# Patient Record
Sex: Male | Born: 2000 | Race: Black or African American | Hispanic: No | Marital: Single | Smoking: Never smoker
Health system: Southern US, Community
[De-identification: ages and names within clinical notes are randomized; demographics above are authoritative.]

---
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
## 2020-12-15 ENCOUNTER — Ambulatory Visit (INDEPENDENT_AMBULATORY_CARE_PROVIDER_SITE_OTHER): Payer: Medicaid Other

## 2020-12-15 ENCOUNTER — Ambulatory Visit (HOSPITAL_COMMUNITY)
Admission: EM | Admit: 2020-12-15 | Discharge: 2020-12-15 | Disposition: A | Discharge status: Home / Self Care | Payer: Medicaid Other | Attending: Emergency Medicine | Admitting: Emergency Medicine

## 2020-12-15 ENCOUNTER — Emergency Department (HOSPITAL_COMMUNITY): Payer: Medicaid Other

## 2020-12-15 ENCOUNTER — Encounter (HOSPITAL_COMMUNITY): Payer: Self-pay | Admitting: Emergency Medicine

## 2020-12-15 ENCOUNTER — Emergency Department (HOSPITAL_COMMUNITY)
Admission: EM | Admit: 2020-12-15 | Discharge: 2020-12-15 | Disposition: A | Discharge status: Home / Self Care | Payer: Medicaid Other | Attending: Emergency Medicine | Admitting: Emergency Medicine

## 2020-12-15 ENCOUNTER — Encounter (HOSPITAL_COMMUNITY): Payer: Self-pay

## 2020-12-15 ENCOUNTER — Other Ambulatory Visit: Payer: Self-pay

## 2020-12-15 DIAGNOSIS — Y9367 Activity, basketball: Secondary | ICD-10-CM | POA: Insufficient documentation

## 2020-12-15 DIAGNOSIS — S43005A Unspecified dislocation of left shoulder joint, initial encounter: Secondary | ICD-10-CM

## 2020-12-15 DIAGNOSIS — W19XXXA Unspecified fall, initial encounter: Secondary | ICD-10-CM | POA: Insufficient documentation

## 2020-12-15 DIAGNOSIS — S4992XA Unspecified injury of left shoulder and upper arm, initial encounter: Secondary | ICD-10-CM | POA: Diagnosis present

## 2020-12-15 MED ORDER — OXYCODONE-ACETAMINOPHEN 5-325 MG PO TABS
1.0000 | ORAL_TABLET | Freq: Once | ORAL | Status: AC
  Administered 2020-12-15: 1 via ORAL
  Filled 2020-12-15: qty 1

## 2020-12-15 MED ORDER — LIDOCAINE HCL (PF) 1 % IJ SOLN
30.0000 mL | Freq: Once | INTRAMUSCULAR | Status: DC
  Filled 2020-12-15: qty 30

## 2020-12-15 NOTE — ED Triage Notes (Signed)
Pt from UC with left shoulder deformity after playing basketball. Currently immobilized in a sling with ice.

## 2020-12-15 NOTE — Discharge Instructions (Addendum)
Go to the Emergency Room for shoulder reduction and further evaluation.

## 2020-12-15 NOTE — ED Provider Notes (Signed)
[facility]    CSN: [id number] Arrival date & time: 12/15/20  1253      History   Chief Complaint Chief Complaint  Patient presents with  . Shoulder Injury    HPI Dakota Baldwin is a 20 y.o. male.   Patient here for evaluation of left shoulder pain after falling while playing basketball approximately 1 hour ago.  Reports concerned that he dislocated it.  Reports dislocating shoulder in the past.  Reports decreased range of motion and sharp pain.  Denies any fevers, chest pain, shortness of breath, N/V/D, numbness, tingling, weakness, abdominal pain, or headaches.       History reviewed. No pertinent past medical history.  There are no problems to display for this patient.   History reviewed. No pertinent surgical history.     Home Medications    Prior to Admission medications   Medication Sig Start Date End Date Taking? Authorizing Provider  HYDROcodone-acetaminophen (NORCO/VICODIN) 5-325 MG per tablet Take 1 tablet by mouth every 4 (four) hours as needed. Patient not taking: No sig reported 03/14/14   Mirian Mo, MD  Pseudoephedrine-APAP-DM (DAYQUIL PO) Take by mouth.    [provider]    Family History Family History  Problem Relation Age of Onset  . Healthy Mother   . Healthy Father     Social History Social History   Tobacco Use  . Smoking status: Never Smoker  . Smokeless tobacco: Never Used  Substance Use Topics  . Alcohol use: No  . Drug use: No     Allergies   Patient has no known allergies.   Review of Systems Review of Systems  Musculoskeletal: Positive for arthralgias and joint swelling.  All other systems reviewed and are negative.    Physical Exam Triage Vital Signs ED Triage Vitals  Enc Vitals Group     BP 12/15/20 1311 125/72     Pulse Rate 12/15/20 1311 (!) 53     Resp 12/15/20 1311 15     Temp 12/15/20 1311 97.7 F (36.5 C)     Temp Source 12/15/20 1311 Oral     SpO2 12/15/20 1311 100 %      Weight --      Height --      Head Circumference --      Peak Flow --      Pain Score 12/15/20 1310 6     Pain Loc --      Pain Edu? --      Excl. in GC? --    No data found.  Updated Vital Signs BP 125/72 (BP Location: Right Arm)   Pulse (!) 53   Temp 97.7 F (36.5 C) (Oral)   Resp 15   SpO2 100%   Visual Acuity Right Eye Distance:   Left Eye Distance:   Bilateral Distance:    Right Eye Near:   Left Eye Near:    Bilateral Near:     Physical Exam Vitals and nursing note reviewed.  Constitutional:      General: He is not in acute distress.    Appearance: Normal appearance. He is not ill-appearing, toxic-appearing or diaphoretic.  HENT:     Head: Normocephalic and atraumatic.  Eyes:     Conjunctiva/sclera: Conjunctivae normal.  Cardiovascular:     Rate and Rhythm: Normal rate.     Pulses: Normal pulses.  Pulmonary:     Effort: Pulmonary effort is normal.  Abdominal:     General: Abdomen is flat.  Musculoskeletal:     Left shoulder: Deformity and bony tenderness present. No swelling. Decreased range of motion. Decreased strength. Normal pulse.     Cervical back: Normal range of motion.  Skin:    General: Skin is warm and dry.  Neurological:     General: No focal deficit present.     Mental Status: He is alert and oriented to person, place, and time.  Psychiatric:        Mood and Affect: Mood normal.      UC Treatments / Results  Labs (all labs ordered are listed, but only abnormal results are displayed) Labs Reviewed - No data to display  EKG   Radiology DG Shoulder Left  Result Date: 12/15/2020 CLINICAL DATA:  Fall, left shoulder pain EXAM: LEFT SHOULDER - 2+ VIEW COMPARISON:  None. FINDINGS: Anteroinferior dislocation of the left glenohumeral joint. Suspected Hill-Sachs impaction deformity of the humeral head. No definite glenoid fracture. AC joint is intact and normal in appearance. Remaining visualized osseous structures are intact. Soft tissues  within normal limits. IMPRESSION: Anteroinferior dislocation of the left glenohumeral joint with suspected Hill-Sachs impaction deformity of the humeral head. Attention on postreduction films. Electronically Signed   By: Duanne Guess D.O.   On: 12/15/2020 13:40    Procedures Procedures (including critical care time)  Medications Ordered in UC Medications - No data to display  Initial Impression / Assessment and Plan / UC Course  I have reviewed the triage vital signs and the nursing notes.  Pertinent labs & imaging results that were available during my care of the patient were reviewed by me and considered in my medical decision making (see chart for details).    X-ray shows anterior inferior dislocation of the left glenohumeral joint with suspected Hill-Sachs impaction deformity of the humeral head. Sling applied and patient sent to ED for further evaluation and reduction of his shoulder.  Patient verbalized understanding and will transport himself to the ED via private vehicle.   Final Clinical Impressions(s) / UC Diagnoses   Final diagnoses:  Dislocation of left shoulder joint, initial encounter     Discharge Instructions     Go to the Emergency Room for shoulder reduction and further evaluation.     ED Prescriptions    None     PDMP not reviewed this encounter.   Ivette Loyal, NP 12/15/20 1355

## 2020-12-15 NOTE — ED Notes (Signed)
Patient is being discharged from the Urgent Care and sent to the Emergency Department via pov.  Per Chales Salmon, NP, patient is in need of higher level of care due to shoulder dislocation. Patient is aware and verbalizes understanding of plan of care.  Vitals:   12/15/20 1311  BP: 125/72  Pulse: (!) 53  Resp: 15  Temp: 97.7 F (36.5 C)  SpO2: 100%

## 2020-12-15 NOTE — Discharge Instructions (Addendum)
Please call the orthopaedics group listed above and schedule an appointment to be seen in 1 week.

## 2020-12-15 NOTE — ED Provider Notes (Signed)
[facility] EMERGENCY DEPARTMENT Provider Note   CSN: [id number] Arrival date & time: 12/15/20  1357     History Chief Complaint  Patient presents with  . Shoulder Injury  . Dislocation    Left shoulder     Dakota Baldwin is a 20 y.o. male.  The history is provided by the patient and medical records.  Shoulder Injury This is a new problem. The current episode started 3 to 5 hours ago. The problem occurs constantly. The problem has not changed since onset.Pertinent negatives include no chest pain, no abdominal pain, no headaches and no shortness of breath. The symptoms are aggravated by twisting, standing and exertion. Nothing relieves the symptoms. He has tried a cold compress for the symptoms. The treatment provided no relief.   Patient reports no medical problems. He was playing basketball and fell backwards on outstretched arm and felt immediate pain in his left shoulder. He has had shoulder dislocations on the left shoulder before and usually they get back into place on their own. He did not hit his head.  He reports no other injuries.     History reviewed. No pertinent past medical history.  There are no problems to display for this patient.   History reviewed. No pertinent surgical history.     Family History  Problem Relation Age of Onset  . Healthy Mother   . Healthy Father     Social History   Tobacco Use  . Smoking status: Never Smoker  . Smokeless tobacco: Never Used  Substance Use Topics  . Alcohol use: No  . Drug use: No    Home Medications Prior to Admission medications   Medication Sig Start Date End Date Taking? Authorizing Provider  HYDROcodone-acetaminophen (NORCO/VICODIN) 5-325 MG per tablet Take 1 tablet by mouth every 4 (four) hours as needed. Patient not taking: No sig reported 03/14/14   Mirian Mo, MD  Pseudoephedrine-APAP-DM (DAYQUIL PO) Take by mouth.    [provider]    Allergies    Patient has  no known allergies.  Review of Systems   Review of Systems  Constitutional: Negative for chills and fever.  HENT: Negative for ear pain and sore throat.   Eyes: Negative for pain and visual disturbance.  Respiratory: Negative for cough and shortness of breath.   Cardiovascular: Negative for chest pain and palpitations.  Gastrointestinal: Negative for abdominal pain and vomiting.  Genitourinary: Negative for dysuria and hematuria.  Musculoskeletal: Positive for arthralgias (L shoulder). Negative for back pain.  Skin: Negative for color change and rash.  Neurological: Negative for seizures, syncope and headaches.  All other systems reviewed and are negative.   Physical Exam Updated Vital Signs BP 138/90   Pulse 70   Temp 97.9 F (36.6 C) (Oral)   Resp 16   SpO2 100%   Physical Exam Vitals and nursing note reviewed.  Constitutional:      Appearance: He is well-developed.  HENT:     Head: Normocephalic and atraumatic.  Eyes:     Conjunctiva/sclera: Conjunctivae normal.  Cardiovascular:     Rate and Rhythm: Normal rate and regular rhythm.     Heart sounds: No murmur heard.   Pulmonary:     Effort: Pulmonary effort is normal. No respiratory distress.     Breath sounds: Normal breath sounds.  Abdominal:     Palpations: Abdomen is soft.     Tenderness: There is no abdominal tenderness.  Musculoskeletal:     Left shoulder: Deformity,  tenderness and bony tenderness present. Decreased range of motion. Normal pulse.     Cervical back: Normal and neck supple.     Thoracic back: Normal.     Lumbar back: Normal.     Comments:  LUE: Strong radial pulse Sensation intact for all nerve distributions. No open wounds. No other TTP about the elbow or wrist.  Skin:    General: Skin is warm and dry.  Neurological:     General: No focal deficit present.     Mental Status: He is alert.     Cranial Nerves: Cranial nerves are intact.     Sensory: Sensation is intact.     Motor:  Motor function is intact.     ED Results / Procedures / Treatments   Labs (all labs ordered are listed, but only abnormal results are displayed) Labs Reviewed - No data to display  EKG None  Radiology DG Shoulder Left  Result Date: 12/15/2020 CLINICAL DATA:  Post reduction. EXAM: LEFT SHOULDER - 2+ VIEW COMPARISON:  Earlier same day FINDINGS: Two views study shows interval reduction of the left shoulder dislocation. Acromioclavicular and coracoclavicular distances are preserved. No evidence for fracture. IMPRESSION: Interval reduction of the left shoulder dislocation. Electronically Signed   By: Kennith Center M.D.   On: 12/15/2020 16:20   DG Shoulder Left  Result Date: 12/15/2020 CLINICAL DATA:  Shoulder dislocation. EXAM: LEFT SHOULDER - 2+ VIEW COMPARISON:  12/15/2020 FINDINGS: Persistent anterior and inferior dislocation of the shoulder with Hill-Sachs deformity. AC joint intact. IMPRESSION: Persistent shoulder dislocation with Hill-Sachs deformity. Electronically Signed   By: Marlan Palau M.D.   On: 12/15/2020 14:46   DG Shoulder Left  Result Date: 12/15/2020 CLINICAL DATA:  Fall, left shoulder pain EXAM: LEFT SHOULDER - 2+ VIEW COMPARISON:  None. FINDINGS: Anteroinferior dislocation of the left glenohumeral joint. Suspected Hill-Sachs impaction deformity of the humeral head. No definite glenoid fracture. AC joint is intact and normal in appearance. Remaining visualized osseous structures are intact. Soft tissues within normal limits. IMPRESSION: Anteroinferior dislocation of the left glenohumeral joint with suspected Hill-Sachs impaction deformity of the humeral head. Attention on postreduction films. Electronically Signed   By: Duanne Guess D.O.   On: 12/15/2020 13:40    Procedures Reduction of dislocation  Date/Time: 12/15/2020 3:44 PM Performed by: Ardeen Fillers, DO Authorized by: Benjiman Core, MD  Consent: Verbal consent obtained. Risks and benefits: risks,  benefits and alternatives were discussed Consent given by: patient Required items: required blood products, implants, devices, and special equipment available Patient identity confirmed: verbally with patient Time out: Immediately prior to procedure a "time out" was called to verify the correct patient, procedure, equipment, support staff and site/side marked as required. Preparation: Patient was prepped and draped in the usual sterile fashion. Local anesthesia used: no  Anesthesia: Local anesthesia used: no  Sedation: Patient sedated: no  Patient tolerance: patient tolerated the procedure well with no immediate complications Comments: Left shoulder dislocation via Kocher method. Pulse, motor and sensory function intact post reduction.      Medications Ordered in ED Medications  oxyCODONE-acetaminophen (PERCOCET/ROXICET) 5-325 MG per tablet 1 tablet (1 tablet Oral Given 12/15/20 1423)    ED Course  I have reviewed the triage vital signs and the nursing notes.  Pertinent labs & imaging results that were available during my care of the patient were reviewed by me and considered in my medical decision making (see chart for details).    MDM Rules/Calculators/A&P  This is a 20 year old male with no prior past medical history who presented to the emergency department with a anteriorly dislocated left shoulder. Shoulder was reduced per procedure note above. Post reduction x-ray confirmed reduction. Placed in a sling. Follow-up instructions given to [facility] in 1 week. NSAIDS, Tylenol prn for pain.  D.c in good condition   Final Clinical Impression(s) / ED Diagnoses Final diagnoses:  Dislocation of left shoulder joint, initial encounter    Rx / DC Orders ED Discharge Orders    None       Ardeen Fillers, DO 12/15/20 1631    Benjiman Core, MD 12/16/20 [phone]

## 2020-12-15 NOTE — ED Triage Notes (Signed)
Pt reports he fell and dislocated the left shoulder playing Basketball 1 hr ago. Pt reports pain.

## 2020-12-15 NOTE — ED Provider Notes (Signed)
Emergency Medicine Provider Triage Evaluation Note  Dakota Baldwin , a 20 y.o. male  was evaluated in triage.  Pt complains of possible left shoulder dislocation.  States that he was playing vascular when this happened.  This is happened to him before.  Denies any numbness or other injuries.  Review of Systems  Positive: Left shoulder deformity and pain Negative: Numbness  Physical Exam  BP 123/81   Pulse (!) 54   Temp 97.9 F (36.6 C) (Oral)   Resp 14   SpO2 100%  Gen:   Awake, no distress Resp:  Normal effort MSK:   Moves extremities without difficulty Other:  Deformity of the left shoulder.  2+ radial pulse palpated.  Normal sensation to light touch  Medical Decision Making  Medically screening exam initiated at 2:14 PM.  Appropriate orders placed.  Dakota Baldwin was informed that the remainder of the evaluation will be completed by another provider, this initial triage assessment does not replace that evaluation, and the importance of remaining in the ED until their evaluation is complete.  Will require x-ray for this likely dislocation   Dietrich Pates, PA-C 12/15/20 1415    Margarita Grizzle, MD 12/19/20 1340

## 2021-04-24 ENCOUNTER — Other Ambulatory Visit: Payer: Self-pay

## 2021-04-24 ENCOUNTER — Emergency Department (HOSPITAL_COMMUNITY): Payer: Medicaid Other

## 2021-04-24 ENCOUNTER — Emergency Department (HOSPITAL_COMMUNITY)
Admission: EM | Admit: 2021-04-24 | Discharge: 2021-04-25 | Disposition: A | Discharge status: Home / Self Care | Payer: Medicaid Other | Attending: Emergency Medicine | Admitting: Emergency Medicine

## 2021-04-24 ENCOUNTER — Encounter (HOSPITAL_COMMUNITY): Payer: Self-pay | Admitting: Emergency Medicine

## 2021-04-24 DIAGNOSIS — M24411 Recurrent dislocation, right shoulder: Secondary | ICD-10-CM | POA: Insufficient documentation

## 2021-04-24 MED ORDER — METHOCARBAMOL 500 MG PO TABS
750.0000 mg | ORAL_TABLET | Freq: Once | ORAL | Status: AC
  Administered 2021-04-24: 750 mg via ORAL
  Filled 2021-04-24: qty 2

## 2021-04-24 NOTE — ED Provider Notes (Signed)
MSE was initiated and I personally evaluated the patient and placed orders (if any) at  11:38 PM on April 24, 2021.  Right shoulder dislocation. H/O multiple dislocations in the past. Occurred while playing basketball prior to arrival.   Today's Vitals   04/24/21 2331 04/24/21 2338  BP: 124/81   Pulse: (!) 57   Resp: 18   SpO2: 100%   Weight:  72 kg  Height:  5\' 11"  (1.803 m)  PainSc:  10-Worst pain ever   Body mass index is 22.14 kg/m.  Anterior dislocation right shoulder Neurovascularly intact  Attempt at reduction unsuccessful in triage.  The patient appears stable so that the remainder of the MSE may be completed by another provider.   , PA-C 04/24/21 2339    2340, MD 04/25/21 [phone]

## 2021-04-24 NOTE — ED Triage Notes (Signed)
Patient lost his balance and fell while playing basketball this evening sustained right shoulder dislocation and bilateral knees skin abrasions .

## 2021-04-25 ENCOUNTER — Emergency Department (HOSPITAL_COMMUNITY): Payer: Medicaid Other

## 2021-04-25 MED ORDER — FENTANYL CITRATE PF 50 MCG/ML IJ SOSY
100.0000 ug | PREFILLED_SYRINGE | Freq: Once | INTRAMUSCULAR | Status: AC
  Administered 2021-04-25: 100 ug via INTRAVENOUS
  Filled 2021-04-25: qty 2

## 2021-04-25 MED ORDER — DIAZEPAM 2 MG PO TABS
2.0000 mg | ORAL_TABLET | Freq: Once | ORAL | Status: AC
  Administered 2021-04-25: 2 mg via ORAL
  Filled 2021-04-25: qty 1

## 2021-04-25 NOTE — ED Provider Notes (Signed)
[facility] EMERGENCY DEPARTMENT Provider Note   CSN: [id number] Arrival date & time: 04/24/21  2029     History Chief Complaint  Patient presents with   R-Shoulder Dislocation     Dakota Baldwin is a 20 y.o. male.  20 yo M here with recurrent shoulder dislocation. Had an episode in June where he dislocated his right shoulder and had it reduced in the emergency room.  He was doing fine and had a follow-up with orthopedics without intervention.  Denies play basketball and fell in his side and and suffered another right shoulder pain and abnormality concerning for dislocation.  Came in for further evaluation.  No injuries elsewhere.  He does have some abrasions but not significantly painful.       History reviewed. No pertinent past medical history.  There are no problems to display for this patient.   History reviewed. No pertinent surgical history.     Family History  Problem Relation Age of Onset   Healthy Mother    Healthy Father     Social History   Tobacco Use   Smoking status: Never   Smokeless tobacco: Never  Substance Use Topics   Alcohol use: No   Drug use: No    Home Medications Prior to Admission medications   Medication Sig Start Date End Date Taking? Authorizing Provider  HYDROcodone-acetaminophen (NORCO/VICODIN) 5-325 MG per tablet Take 1 tablet by mouth every 4 (four) hours as needed. Patient not taking: No sig reported 03/14/14   Mirian Mo, MD  Pseudoephedrine-APAP-DM (DAYQUIL PO) Take by mouth.    [provider]    Allergies    Patient has no known allergies.  Review of Systems   Review of Systems  All other systems reviewed and are negative.  Physical Exam Updated Vital Signs BP 102/83   Pulse 60   Resp 14   Ht 5\' 11"  (1.803 m)   Wt 72 kg   SpO2 100%   BMI 22.14 kg/m   Physical Exam Vitals and nursing note reviewed.  Constitutional:      Appearance: He is well-developed.  HENT:     Head:  Normocephalic and atraumatic.  Eyes:     Pupils: Pupils are equal, round, and reactive to light.  Cardiovascular:     Rate and Rhythm: Normal rate.  Pulmonary:     Effort: Pulmonary effort is normal. No respiratory distress.  Abdominal:     General: Abdomen is flat. There is no distension.  Musculoskeletal:        General: Tenderness and deformity (right shoulder) present. Normal range of motion.     Cervical back: Normal range of motion.  Skin:    General: Skin is warm and dry.  Neurological:     General: No focal deficit present.     Mental Status: He is alert.    ED Results / Procedures / Treatments   Labs (all labs ordered are listed, but only abnormal results are displayed) Labs Reviewed - No data to display  EKG None  Radiology DG Shoulder Right  Result Date: 04/25/2021 CLINICAL DATA:  Glenohumeral reduction EXAM: RIGHT SHOULDER - 2+ VIEW COMPARISON:  None. FINDINGS: Anatomic alignment at the right glenohumeral joint following reduction. No visible fracture. IMPRESSION: Anatomic alignment of the right glenohumeral joint following reduction. Electronically Signed   By: 06/25/2021 M.D.   On: 04/25/2021 02:54   DG Shoulder Right  Result Date: 04/25/2021 CLINICAL DATA:  Shoulder dislocation EXAM: RIGHT SHOULDER -  2+ VIEW COMPARISON:  None. FINDINGS: Anterior dislocation at the right glenohumeral joint. No fracture visible IMPRESSION: Anterior right glenohumeral joint dislocation. Electronically Signed   By: Deatra Robinson M.D.   On: 04/25/2021 00:18    Procedures .Ortho Injury Treatment  Date/Time: 04/25/2021 2:56 AM Performed by: Marily Memos, MD Authorized by: Marily Memos, MD   Consent:    Consent obtained:  Verbal   Consent given by:  Patient   Risks discussed:  Fracture, irreducible dislocation, recurrent dislocation, nerve damage, restricted joint movement, stiffness and vascular damage   Alternatives discussed:  No treatment, alternative treatment,  immobilization, referral and delayed treatmentInjury location: shoulder Location details: right shoulder Injury type: dislocation Dislocation type: anterior Hill-Sachs deformity: no Chronicity: recurrent Pre-procedure neurovascular assessment: neurovascularly intact Pre-procedure distal perfusion: normal Pre-procedure neurological function: normal Pre-procedure range of motion: reduced  Anesthesia: Local anesthesia used: no  Patient sedated: NoManipulation performed: yes Reduction method: Kocher technique. Reduction successful: yes X-ray confirmed reduction: yes Immobilization: sling Splint Applied by: ED Provider Post-procedure neurovascular assessment: post-procedure neurovascularly intact Post-procedure distal perfusion: normal Post-procedure neurological function: normal Post-procedure range of motion: improved     Medications Ordered in ED Medications  methocarbamol (ROBAXIN) tablet 750 mg (750 mg Oral Given 04/24/21 2343)  diazepam (VALIUM) tablet 2 mg (2 mg Oral Given 04/25/21 0215)  fentaNYL (SUBLIMAZE) injection 100 mcg (100 mcg Intravenous Given 04/25/21 0215)    ED Course  I have reviewed the triage vital signs and the nursing notes.  Pertinent labs & imaging results that were available during my care of the patient were reviewed by me and considered in my medical decision making (see chart for details).    MDM Rules/Calculators/A&P                         Recurrent shoulder dislocation.  Reduced as per procedure note above.  Did not need sedation just gave some analgesic and muscle relaxer and patient tolerated very well.  He will follow-up with the [facility] providers that he saw previously for further recommendations secondary to recurrent dislocation.  Sling immobilizer placed. NVI.  Final Clinical Impression(s) / ED Diagnoses Final diagnoses:  Recurrent dislocation of right shoulder    Rx / DC Orders ED Discharge Orders     None         Aracelys Glade, Barbara Cower, MD 04/25/21 [phone]

## 2022-08-22 ENCOUNTER — Emergency Department (HOSPITAL_BASED_OUTPATIENT_CLINIC_OR_DEPARTMENT_OTHER)
Admission: EM | Admit: 2022-08-22 | Discharge: 2022-08-22 | Disposition: A | Discharge status: Home / Self Care | Payer: Self-pay | Attending: Emergency Medicine | Admitting: Emergency Medicine

## 2022-08-22 ENCOUNTER — Other Ambulatory Visit: Payer: Self-pay

## 2022-08-22 ENCOUNTER — Emergency Department (HOSPITAL_BASED_OUTPATIENT_CLINIC_OR_DEPARTMENT_OTHER): Payer: Self-pay

## 2022-08-22 ENCOUNTER — Encounter (HOSPITAL_BASED_OUTPATIENT_CLINIC_OR_DEPARTMENT_OTHER): Payer: Self-pay

## 2022-08-22 DIAGNOSIS — S4291XA Fracture of right shoulder girdle, part unspecified, initial encounter for closed fracture: Secondary | ICD-10-CM

## 2022-08-22 DIAGNOSIS — X58XXXA Exposure to other specified factors, initial encounter: Secondary | ICD-10-CM | POA: Insufficient documentation

## 2022-08-22 DIAGNOSIS — S43014A Anterior dislocation of right humerus, initial encounter: Secondary | ICD-10-CM | POA: Insufficient documentation

## 2022-08-22 MED ORDER — FENTANYL CITRATE PF 50 MCG/ML IJ SOSY
100.0000 ug | PREFILLED_SYRINGE | Freq: Once | INTRAMUSCULAR | Status: AC
  Administered 2022-08-22: 100 ug via INTRAVENOUS
  Filled 2022-08-22: qty 2

## 2022-08-22 MED ORDER — DIAZEPAM 2 MG PO TABS
2.0000 mg | ORAL_TABLET | Freq: Once | ORAL | Status: AC
Start: 2022-08-22 — End: 2022-08-22
  Administered 2022-08-22: 2 mg via ORAL
  Filled 2022-08-22: qty 1

## 2022-08-22 MED ORDER — METHOCARBAMOL 500 MG PO TABS
750.0000 mg | ORAL_TABLET | Freq: Once | ORAL | Status: AC
  Administered 2022-08-22: 750 mg via ORAL
  Filled 2022-08-22: qty 2

## 2022-08-22 NOTE — ED Notes (Signed)
Patient Alert and oriented to baseline. Stable and ambulatory to baseline. Patient verbalized understanding of the discharge instructions.  Patient belongings were taken by the patient.   

## 2022-08-22 NOTE — ED Provider Notes (Signed)
Reduction of dislocation  Date/Time: 08/22/2022 11:40 PM  Performed by: Blanchie Dessert, MD Authorized by: Blanchie Dessert, MD  Consent: Verbal consent obtained. Consent given by: patient Imaging studies: imaging studies available Patient identity confirmed: verbally with patient Time out: Immediately prior to procedure a "time out" was called to verify the correct patient, procedure, equipment, support staff and site/side marked as required. Local anesthesia used: no  Anesthesia: Local anesthesia used: no  Sedation: Patient sedated: yes Sedation type: anxiolysis Sedatives: diazepam Analgesia: fentanyl  Patient tolerance: patient tolerated the procedure well with no immediate complications Comments: Pt was awake and cooperative throughout the procedure.  With shoulder external rotation and abduction reduction was accomplished with loud click and deformity resolved and pain improved.       Blanchie Dessert, MD 08/22/22 2342

## 2022-08-22 NOTE — Discharge Instructions (Signed)
As we discussed, your shoulder dislocation was successfully reduced in the emergency department today.  You will need to follow-up with orthopedics for continued evaluation and management of your recurrent dislocations.  Please call them to schedule an appointment at your earliest convenience.  In the interim, please wear the shoulder sling at all times until you are able to follow-up with them.  You will need to rest and ice your arm and take Tylenol/ibuprofen as needed for pain.  Return if development of any new or worsening symptoms.

## 2022-08-22 NOTE — ED Triage Notes (Signed)
Patient here POV from Home.  Endorses Patting somebody on the Back 4-5 hours ago and his Right Shoulder became dislocated. History of same.   Palpable pulse Distally.   NAD Noted during Triage. A&Ox4. GCS 15. Ambulatory.

## 2022-08-22 NOTE — ED Provider Notes (Signed)
[facility] Provider Note   CSN: [id number] Arrival date & time: 08/22/22  1624     History  Chief Complaint  Patient presents with   Shoulder Injury    Dakota Baldwin is a 22 y.o. male.  Patient with recurrent shoulder dislocations presents today with complaints of right shoulder injury. He states that same occurred immediately prior to arrival today when he went to extend his arm and patted his friend on the back. He states that this has occurred several times previously and he is normally able to reduce the dislocation himself but this time he was unable to. He denies any other injuries or complaints.   The history is provided by the patient. No language interpreter was used.  Shoulder Injury       Home Medications Prior to Admission medications   Medication Sig Start Date End Date Taking? Authorizing Provider  HYDROcodone-acetaminophen (NORCO/VICODIN) 5-325 MG per tablet Take 1 tablet by mouth every 4 (four) hours as needed. Patient not taking: No sig reported 03/14/14   Debby Freiberg, MD  Pseudoephedrine-APAP-DM (DAYQUIL PO) Take by mouth.    [provider]      Allergies    Patient has no known allergies.    Review of Systems   Review of Systems  Musculoskeletal:  Positive for arthralgias.  All other systems reviewed and are negative.   Physical Exam Updated Vital Signs BP 127/78 (BP Location: Left Arm)   Pulse 65   Temp 97.9 F (36.6 C)   Resp 16   Ht 5\' 11"  (1.803 m)   Wt 68 kg   SpO2 100%   BMI 20.92 kg/m  Physical Exam Vitals and nursing note reviewed.  Constitutional:      General: He is not in acute distress.    Appearance: Normal appearance. He is normal weight. He is not ill-appearing, toxic-appearing or diaphoretic.  HENT:     Head: Normocephalic and atraumatic.  Cardiovascular:     Rate and Rhythm: Normal rate.  Pulmonary:     Effort: Pulmonary effort is normal. No respiratory  distress.  Musculoskeletal:        General: Normal range of motion.     Cervical back: Normal range of motion.     Comments: Obviously dislocated right shoulder. Radial and ulnar pulses intact and 2+.  Skin:    General: Skin is warm and dry.  Neurological:     General: No focal deficit present.     Mental Status: He is alert.  Psychiatric:        Mood and Affect: Mood normal.        Behavior: Behavior normal.     ED Results / Procedures / Treatments   Labs (all labs ordered are listed, but only abnormal results are displayed) Labs Reviewed - No data to display  EKG None  Radiology DG Shoulder Right Port  Result Date: 08/22/2022 CLINICAL DATA:  Right shoulder dislocation today. Postreduction film. EXAM: RIGHT SHOULDER - 1 VIEW COMPARISON:  Right shoulder radiographs 08/22/2022, earlier same day. FINDINGS: Single frontal view of the right shoulder. There is now appropriate glenoid and humeral head positioning and overlap for frontal view. Normal alignment of the acromioclavicular joint. No definite acute fracture is seen on limited single view. The visualized portion of the right lung is unremarkable. IMPRESSION: Successful reduction of right shoulder dislocation. Electronically Signed   By: Yvonne Kendall M.D.   On: 08/22/2022 18:37   DG Shoulder Right  Portable  Result Date: 08/22/2022 CLINICAL DATA:  Right shoulder dislocation. EXAM: RIGHT SHOULDER - 1 VIEW COMPARISON:  Right shoulder radiograph dated 04/25/2021. FINDINGS: There is anterior dislocation of the right shoulder with impaction of the humeral head on the inferior bony glenoid. Mild indentation of the posterolateral humeral head may represent Hill-Sachs injury. No other acute fracture. The bones are well mineralized. No arthritic changes. The soft tissues are unremarkable. IMPRESSION: Anterior dislocation of the right shoulder with possible Hill-Sachs injury. Electronically Signed   By: Anner Crete M.D.   On: 08/22/2022  17:12    Procedures Procedures    Medications Ordered in ED Medications  methocarbamol (ROBAXIN) tablet 750 mg (750 mg Oral Given 08/22/22 1708)  diazepam (VALIUM) tablet 2 mg (2 mg Oral Given 08/22/22 1708)  fentaNYL (SUBLIMAZE) injection 100 mcg (100 mcg Intravenous Given 08/22/22 1806)    ED Course/ Medical Decision Making/ A&P                             Medical Decision Making Amount and/or Complexity of Data Reviewed Radiology: ordered.  Risk Prescription drug management.   Patient presents today with complaints of right shoulder dislocation immediately prior to arrival today. This is a recurrent problem.  X-ray imaging confirms anterior right shoulder dislocation.  I personally reviewed and interpreted this imaging and agree with radiology interpretation.  Given robaxin, valium, and fentanyl and shoulder successfully reduced by my attending Dr. Maryan Rued. Please see her note for details. Procedure well tolerated.  Postreduction x-ray confirms successful reduction.  I have personally reviewed and interpreted this imaging and agree with radiology interpretation.  Shoulder placed in a sling immobilizer.  Plan for follow-up with [facility] providers that he saw previously for further recommendations secondary to recurrent dislocation. RICE and tylenol/ibuprofen recommended. Patient is stable for discharge.  He is understanding and amenable with plan, educated on red flag symptoms to prompt immediate return.  Patient discharged in stable condition.   This is a shared visit with supervising physician Dr. Maryan Rued who has independently evaluated patient & provided guidance in evaluation/management/disposition, in agreement with care    Final Clinical Impression(s) / ED Diagnoses Final diagnoses:  Traumatic closed displaced fracture of right shoulder with anterior dislocation, initial encounter    Rx / DC Orders ED Discharge Orders     None     An After Visit Summary was printed  and given to the patient.     Nestor Lewandowsky 08/22/22 Levonne Spiller, MD 08/22/22 [phone]

## 2023-05-02 IMAGING — DX DG SHOULDER 2+V*R*
2 series · 2 of 2 positions shown · non-contrast
Comparison: None.

CLINICAL DATA: Glenohumeral reduction

EXAM:
RIGHT SHOULDER - 2+ VIEW

[shoulder ap]
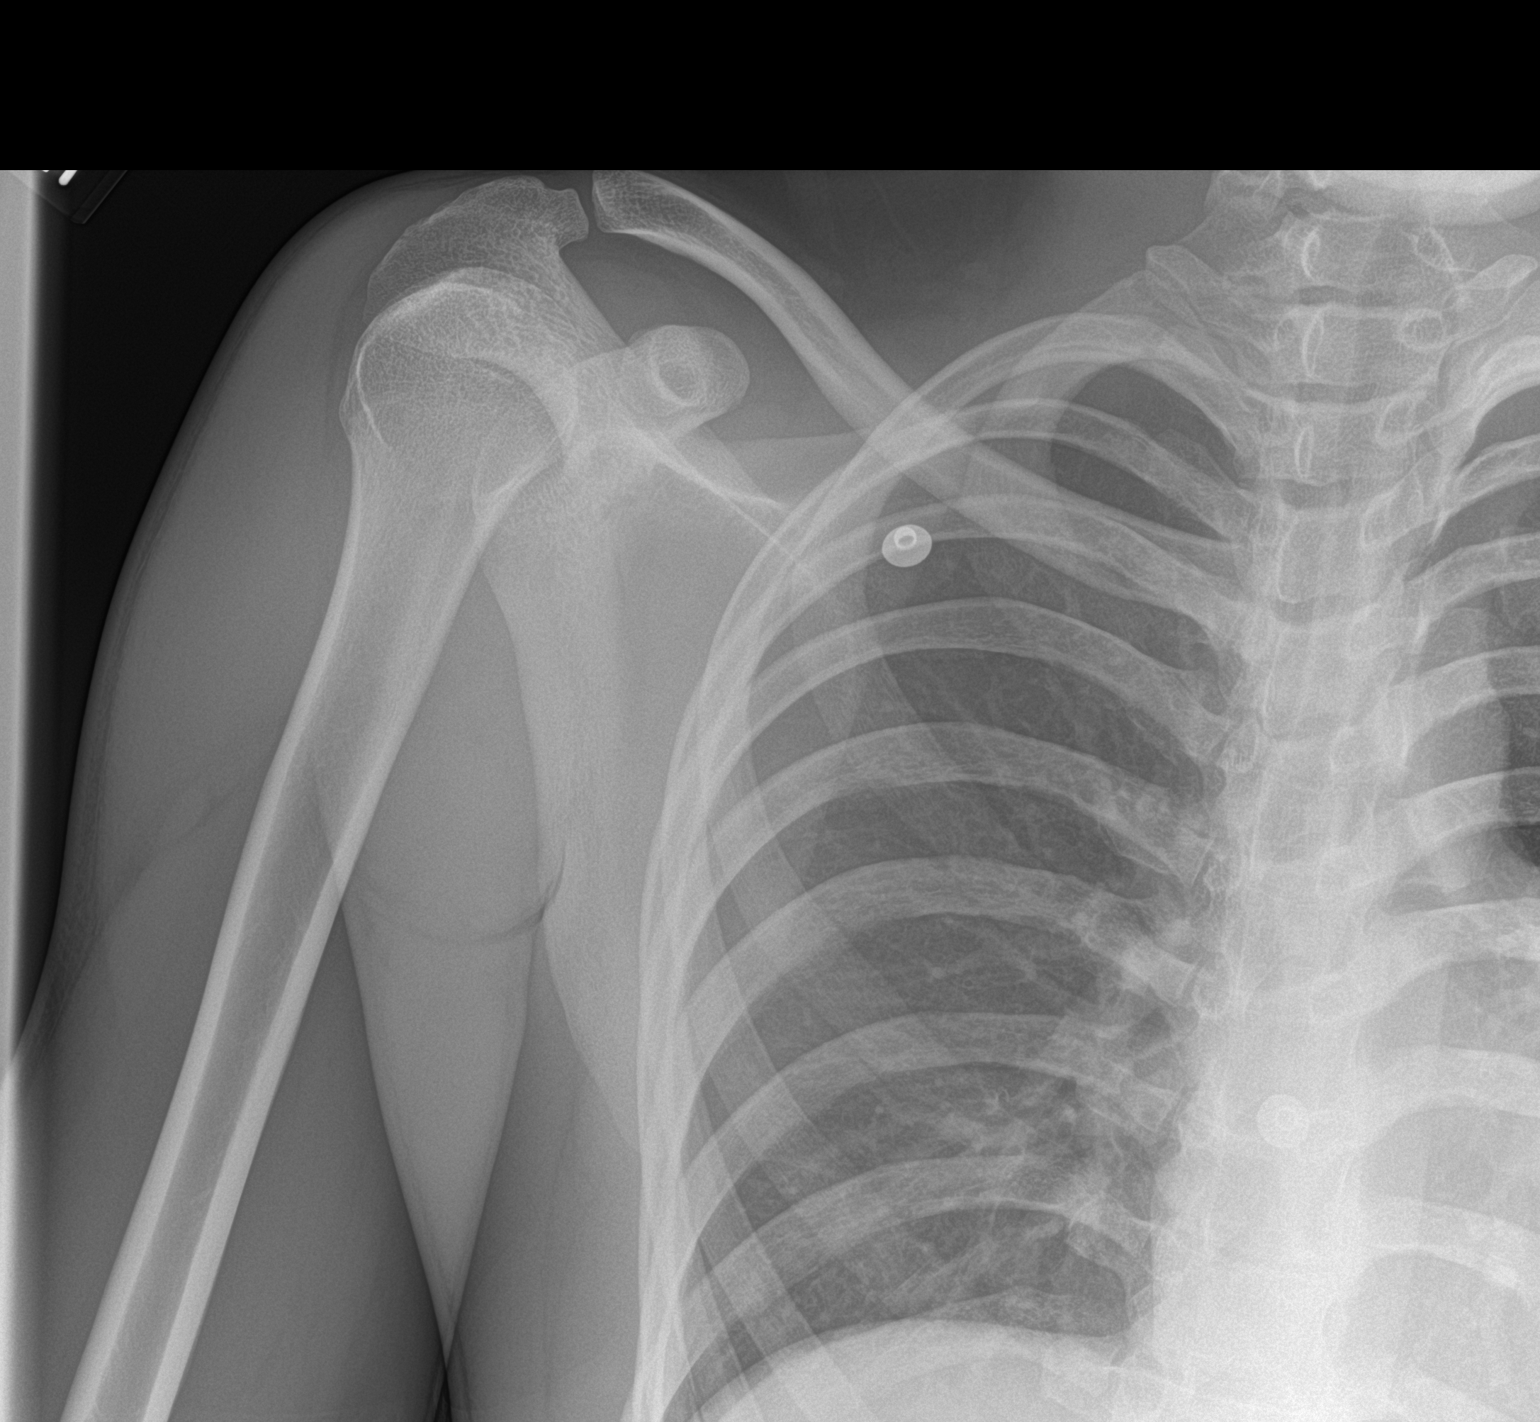

[shoulder obl]
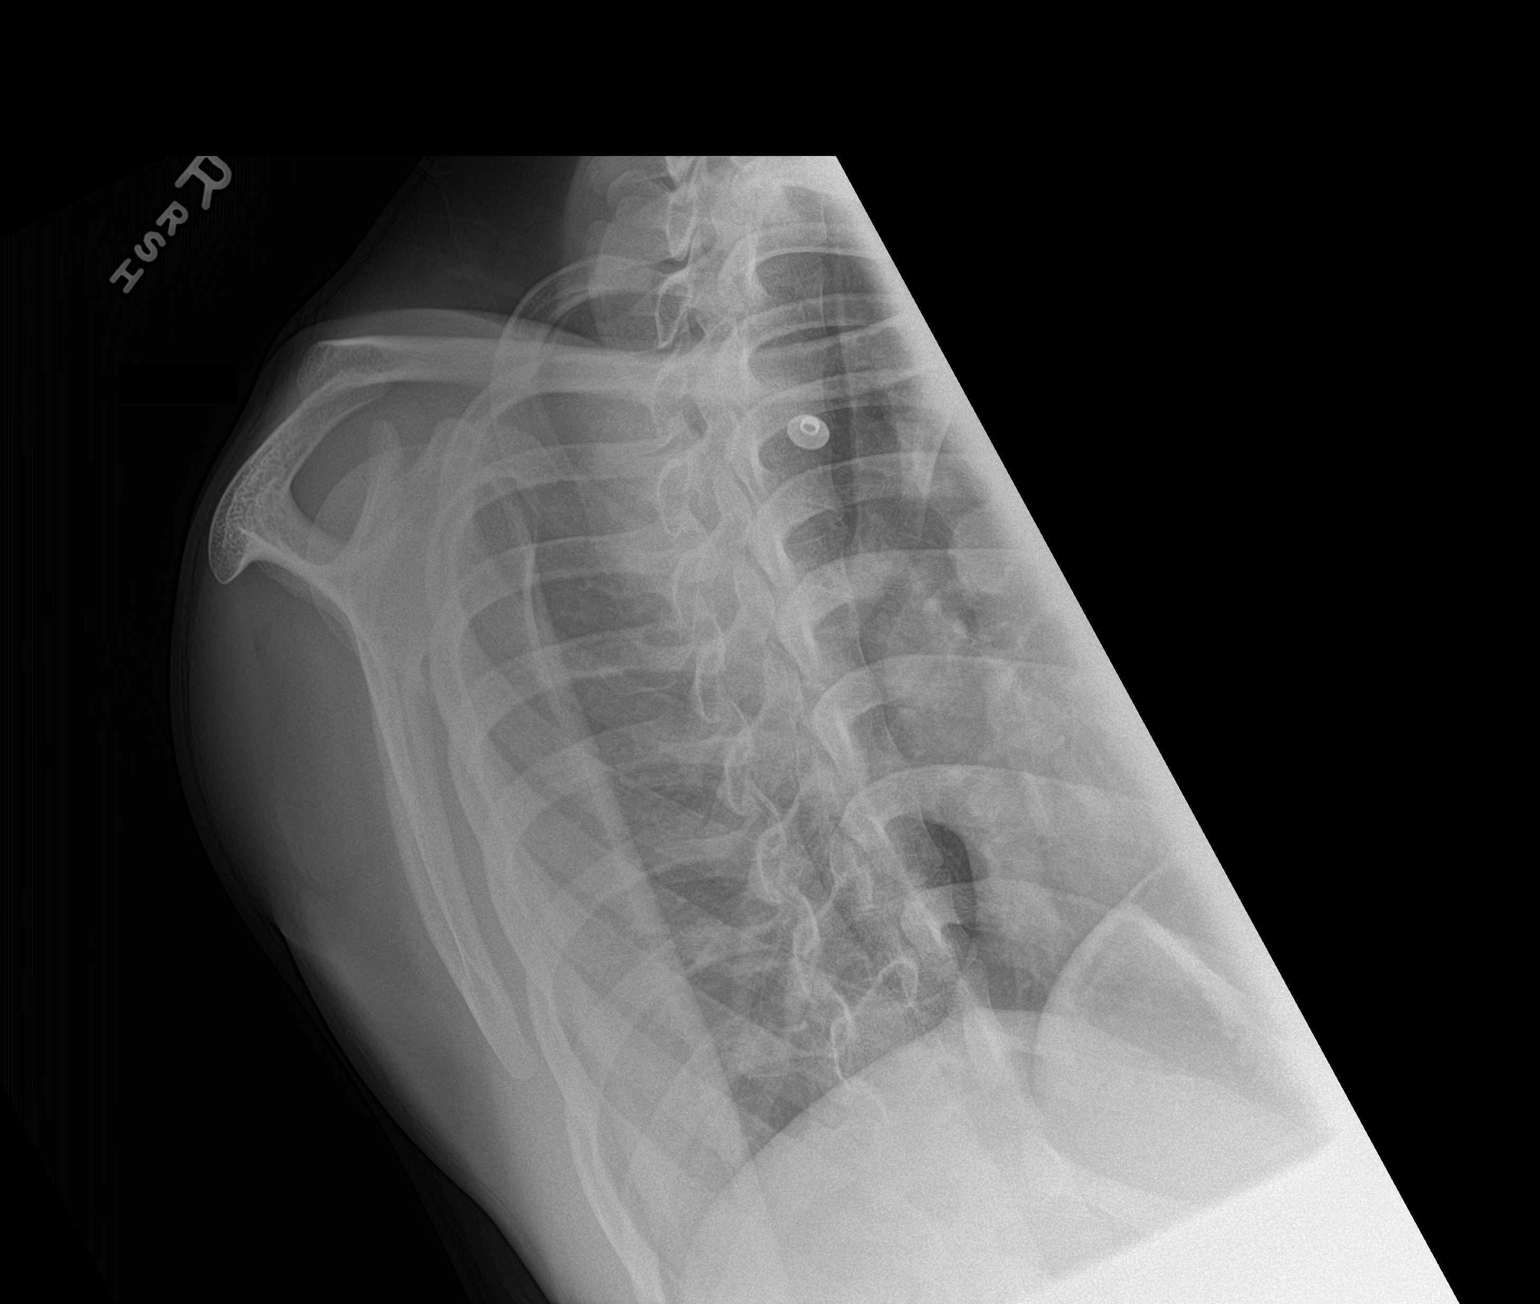

[2 of 2 positions shown; findings below may reference images not displayed]

FINDINGS: Anatomic alignment at the right glenohumeral joint following
reduction. No visible fracture.
IMPRESSION: Anatomic alignment of the right glenohumeral joint following
reduction.
